# Patient Record
Sex: Female | Born: 1995 | Race: Black or African American | Hispanic: No | Marital: Single | Smoking: Never smoker
Health system: Southern US, Community
[De-identification: ages and names within clinical notes are randomized; demographics above are authoritative.]

## PROBLEM LIST (undated history)

## (undated) DIAGNOSIS — N6002 Solitary cyst of left breast: Secondary | ICD-10-CM

---
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
## 2016-07-22 ENCOUNTER — Emergency Department (HOSPITAL_COMMUNITY)
Admission: EM | Admit: 2016-07-22 | Discharge: 2016-07-22 | Disposition: A | Discharge status: Home / Self Care | Payer: BLUE CROSS/BLUE SHIELD | Attending: Emergency Medicine | Admitting: Emergency Medicine

## 2016-07-22 DIAGNOSIS — H109 Unspecified conjunctivitis: Secondary | ICD-10-CM | POA: Insufficient documentation

## 2016-07-22 DIAGNOSIS — H02843 Edema of right eye, unspecified eyelid: Secondary | ICD-10-CM

## 2016-07-22 DIAGNOSIS — H578 Other specified disorders of eye and adnexa: Secondary | ICD-10-CM | POA: Diagnosis present

## 2016-07-22 MED ORDER — TETRACAINE HCL 0.5 % OP SOLN
2.0000 [drp] | Freq: Once | OPHTHALMIC | Status: AC
  Administered 2016-07-22: 2 [drp] via OPHTHALMIC
  Filled 2016-07-22: qty 4

## 2016-07-22 MED ORDER — IBUPROFEN 600 MG PO TABS: 600.0000 mg | ORAL_TABLET | Freq: Four times a day (QID) | ORAL | 0 refills | Status: AC | PRN

## 2016-07-22 MED ORDER — FLUORESCEIN SODIUM 1 MG OP STRP
1.0000 | ORAL_STRIP | Freq: Once | OPHTHALMIC | Status: AC
  Administered 2016-07-22: 1 via OPHTHALMIC
  Filled 2016-07-22: qty 1

## 2016-07-22 MED ORDER — IBUPROFEN 200 MG PO TABS
600.0000 mg | ORAL_TABLET | Freq: Once | ORAL | Status: AC
  Administered 2016-07-22: 600 mg via ORAL
  Filled 2016-07-22: qty 3

## 2016-07-22 MED ORDER — ERYTHROMYCIN 5 MG/GM OP OINT: TOPICAL_OINTMENT | OPHTHALMIC | 0 refills | Status: AC

## 2016-07-22 NOTE — ED Notes (Signed)
PT DISCHARGED. INSTRUCTIONS AND PRESCRIPTIONS GIVEN. AAOX4. PT IN NO APPARENT DISTRESS. THE OPPORTUNITY TO ASK QUESTIONS WAS PROVIDED. 

## 2016-07-22 NOTE — ED Triage Notes (Signed)
Pt states that she woke up with blurred vision in the R eye but now has R eye lid swelling. Wore contacts yesterday. Alert and oriented.

## 2016-07-22 NOTE — ED Provider Notes (Signed)
[facility]-EMERGENCY DEPT Provider Note   CSN: [id number] Arrival date & time: 07/22/16  1956  First Provider Contact:  First MD Initiated Contact with Patient 07/22/16 2008   By signing my name below, I, Evon Slack, attest that this documentation has been prepared under the direction and in the presence of Briellah Baik Y Varian Innes, [place]. Electronically Signed: Evon Slack, ED Scribe. 07/22/16. 8:13 PM.     History   Chief Complaint Chief Complaint  Patient presents with  . Facial Swelling    HPI The history is provided by the patient. No language interpreter was used.   HPI Comments: Brenda Cameron is a 20 y.o. female who presents to the Emergency Department complaining of right eyelid swelling onset today. Pt states that she has had associated pain and blurred vision in the right eye. She states she woke up this morning and noticed her right eye vision was slightly blurry. States over the course of the day the eye became painful and here eyelid became swollen. She felt like it was hurting on the surface of her eyeball. Pt reports that she did wear contacts 1 day ago but typically wears her glasses for the most part.  Pt doesn't report any medications PTA. Pt denies injury or trauma to the eye. Denies floaters. Pt denies loss of vision. Denies discharge but endorses tearing due to the pain. Denies itching.   No past medical history on file.  There are no active problems to display for this patient.   No past surgical history on file.  OB History    No data available       Home Medications    Prior to Admission medications   Medication Sig Start Date End Date Taking? Authorizing Provider  albuterol (PROVENTIL HFA;VENTOLIN HFA) 108 (90 BASE) MCG/ACT inhaler Inhale 3 puffs into the lungs every 6 (six) hours as needed.    Historical Provider, MD  loratadine-pseudoephedrine (CLARITIN-D 24-HOUR) 10-240 MG per 24 hr tablet Take 1 tablet by mouth daily.    Historical Provider, MD     Family History No family history on file.  Social History Social History  Substance Use Topics  . Smoking status: Not on file  . Smokeless tobacco: Not on file  . Alcohol use Not on file     Allergies   Codeine   Review of Systems Review of Systems  HENT: Positive for facial swelling (right eye).   Eyes: Positive for pain and visual disturbance.  10 Systems reviewed and are negative for acute change except as noted in the HPI.    Physical Exam Updated Vital Signs BP 139/81 (BP Location: Left Arm)   Pulse 93   Temp 98.6 F (37 C) (Oral)   Resp 18   LMP 07/11/2016 (Approximate)   SpO2 100%   Physical Exam  Constitutional: She is oriented to person, place, and time. She appears well-developed and well-nourished. No distress.  HENT:  Head: Normocephalic and atraumatic.  Eyes: EOM are normal. Pupils are equal, round, and reactive to light.  R eyelid edematous. Minimal tenderness. No erythema L external eye unremarkable R conjunctiva injected. EOM intact. PERRL.  No fluorescein uptake on exam IOP 20 bilaterally  Neck: Neck supple. No tracheal deviation present.  Cardiovascular: Normal rate.   Pulmonary/Chest: Effort normal. No respiratory distress.  Musculoskeletal: Normal range of motion.  Neurological: She is alert and oriented to person, place, and time.  Skin: Skin is warm and dry.  Psychiatric: She has a normal mood and affect.  Her behavior is normal.  Nursing note and vitals reviewed.     Visual Acuity  Right Eye Distance:   Left Eye Distance:   Bilateral Distance:    Right Eye Near:   Left Eye Near:    Bilateral Near:      ED Treatments / Results  Labs (all labs ordered are listed, but only abnormal results are displayed) Labs Reviewed - No data to display  EKG  EKG Interpretation None       Radiology No results found.  Procedures Procedures (including critical care time)  Medications Ordered in ED Medications  tetracaine  (PONTOCAINE) 0.5 % ophthalmic solution 2 drop (not administered)  fluorescein ophthalmic strip 1 strip (not administered)     Initial Impression / Assessment and Plan / ED Course  I have reviewed the triage vital signs and the nursing notes.  Pertinent labs & imaging results that were available during my care of the patient were reviewed by me and considered in my medical decision making (see chart for details).  Pt is an otherwise healthy 20 y.o. female presenting with right eye complaints. She states it started as somewhat blurry vision this morning, now her eye is red and painful with upper eyelid edema. No fluorescein uptake noted on exam. IOP normal. Right eye conjunctiva is diffusely injected. Pt did not bring her glasses with her so visual acuity screening is difficult but her right eye does have mildly decreased acuity compared to right. Will cover for corneal or bacterial process with erythromycin ointment. Ibuprofen given as well. Low suspicion for uveitis/iritis. Doubt acute glaucoma. However, instructed close f/u with ophthalmology within 24 hrs. Strict ER return precautions given.   Final Clinical Impressions(s) / ED Diagnoses   Final diagnoses:  Conjunctivitis of right eye  Swelling of eyelid, right    New Prescriptions New Prescriptions   ERYTHROMYCIN OPHTHALMIC OINTMENT    Place a 1/2 inch ribbon of ointment into the lower eyelid three times daily   IBUPROFEN (ADVIL,MOTRIN) 600 MG TABLET    Take 1 tablet (600 mg total) by mouth every 6 (six) hours as needed.     I personally performed the services described in this documentation, which was scribed in my presence. The recorded information has been reviewed and is accurate.    Carlene Coria, Cordelia Poche 07/22/16 2116    Zadie Rhine, MD 07/22/16 [phone]

## 2016-07-22 NOTE — Discharge Instructions (Signed)
Use the erythromycin ointment as directed. Please call Dr. Margaretmary Eddy office in the morning to schedule a follow up appointment as soon as possible.

## 2016-07-22 NOTE — ED Notes (Addendum)
UNABLE TO PERFORM VISUAL ACUITY DUE TO PT NOT HAVING HER GLASSES PRESENT. PA SERENA MADE AWARE.

## 2019-07-22 ENCOUNTER — Other Ambulatory Visit: Payer: Self-pay | Admitting: Obstetrics and Gynecology

## 2019-07-22 ENCOUNTER — Other Ambulatory Visit (HOSPITAL_COMMUNITY)
Admission: RE | Admit: 2019-07-22 | Discharge: 2019-07-22 | Disposition: A | Discharge status: Home / Self Care | Payer: 59 | Source: Ambulatory Visit | Attending: Obstetrics and Gynecology | Admitting: Obstetrics and Gynecology

## 2019-07-22 DIAGNOSIS — Z01419 Encounter for gynecological examination (general) (routine) without abnormal findings: Secondary | ICD-10-CM | POA: Diagnosis present

## 2019-07-23 LAB — CYTOLOGY - PAP
Chlamydia: POSITIVE — AB
Diagnosis: NEGATIVE
Neisseria Gonorrhea: NEGATIVE

## 2020-03-03 ENCOUNTER — Ambulatory Visit: Payer: 59

## 2020-03-03 ENCOUNTER — Ambulatory Visit: Payer: 59 | Attending: Internal Medicine

## 2020-03-03 DIAGNOSIS — Z23 Encounter for immunization: Secondary | ICD-10-CM | POA: Insufficient documentation

## 2020-03-03 NOTE — Progress Notes (Signed)
   Covid-19 Vaccination Clinic  Name:  Brenda Cameron    MRN: [id number] DOB: [birth year]  03/03/2020  [name] was observed post Covid-19 immunization for 30 minutes based on pre-vaccination screening without incident. She was provided with Vaccine Information Sheet and instruction to access the V-Safe system.   [name] was instructed to call 911 with any severe reactions post vaccine: Marland Kitchen Difficulty breathing  . Swelling of face and throat  . A fast heartbeat  . A bad rash all over body  . Dizziness and weakness   Immunizations Administered    Name Date Dose VIS Date Route   Pfizer COVID-19 Vaccine 03/03/2020  4:41 PM 0.3 mL 12/11/2019 Intramuscular   Manufacturer: [employer], [employer]   Lot: [id number]   NDC: [address]-0

## 2020-03-30 ENCOUNTER — Ambulatory Visit: Payer: 59 | Attending: Internal Medicine

## 2020-03-30 DIAGNOSIS — Z23 Encounter for immunization: Secondary | ICD-10-CM

## 2020-03-30 NOTE — Progress Notes (Signed)
   Covid-19 Vaccination Clinic  Name:  TYLYN STANKOVICH    MRN: [id number] DOB: [birth year]  03/30/2020  Ms. Cullop was observed post Covid-19 immunization for 15 minutes without incident. She was provided with Vaccine Information Sheet and instruction to access the V-Safe system.   Ms. Demattia was instructed to call 911 with any severe reactions post vaccine: Marland Kitchen Difficulty breathing  . Swelling of face and throat  . A fast heartbeat  . A bad rash all over body  . Dizziness and weakness   Immunizations Administered    Name Date Dose VIS Date Route   Pfizer COVID-19 Vaccine 03/30/2020  4:42 PM 0.3 mL 12/11/2019 Intramuscular   Manufacturer: [employer], [employer]   Lot: [id number]   NDC: [address]-7

## 2023-05-25 ENCOUNTER — Emergency Department (HOSPITAL_COMMUNITY)
Admission: EM | Admit: 2023-05-25 | Discharge: 2023-05-25 | Disposition: A | Discharge status: Home / Self Care | Payer: 59 | Attending: Emergency Medicine | Admitting: Emergency Medicine

## 2023-05-25 ENCOUNTER — Other Ambulatory Visit: Payer: Self-pay

## 2023-05-25 ENCOUNTER — Encounter (HOSPITAL_COMMUNITY): Payer: Self-pay | Admitting: *Deleted

## 2023-05-25 DIAGNOSIS — N611 Abscess of the breast and nipple: Secondary | ICD-10-CM

## 2023-05-25 HISTORY — DX: Solitary cyst of left breast: N60.02

## 2023-05-25 NOTE — Discharge Instructions (Addendum)
You were seen in the emergency department with concerns for a breast abscess. Based on my evaluation of you, there does appear to be some mass/fluid in the left breast. Given the risk of drainage of a breast abscess without the proper imaging, these are typically managed at the [facility]. I have provided you a referral to [facility] who will facilitate getting this all arranged for you. You may take Tylenol, Ibuprofen, or Aleve for pain as needed. If you are concerned that symptoms are worsening or you begin to experience significant redness or drainage, please return to the ER for evaluation.

## 2023-05-25 NOTE — ED Triage Notes (Signed)
Cyst on left breast which has increased in size since Sunday. Has history and has had it darined in the past.

## 2023-05-25 NOTE — ED Provider Notes (Signed)
[place] EMERGENCY DEPARTMENT AT [facility] Provider Note   CSN: [id number] Arrival date & time: 05/25/23  1840     History Chief Complaint  Patient presents with   Cyst    Brenda Cameron is a 27 y.o. female. Patient presented to the ED with concerns for a breast cyst. She reports that this area has been enlarging over the last few days and become increasingly painful. Not pregnant or breastfeeding at this time. Denies drainage coming from breast. No fevers. Has previously had a similar mass in her breast that she had I&D at that time and has not had recurrence of abscess since about 2016. Does not currently have a PCP.   HPI     Home Medications Prior to Admission medications   Medication Sig Start Date End Date Taking? Authorizing Provider  albuterol (PROVENTIL HFA;VENTOLIN HFA) 108 (90 BASE) MCG/ACT inhaler Inhale 3 puffs into the lungs every 6 (six) hours as needed.    [provider]  erythromycin ophthalmic ointment Place a 1/2 inch ribbon of ointment into the lower eyelid three times daily 07/22/16   Eliseo Squires, PA-C  ibuprofen (ADVIL,MOTRIN) 600 MG tablet Take 1 tablet (600 mg total) by mouth every 6 (six) hours as needed. 07/22/16   Eliseo Squires, PA-C  loratadine-pseudoephedrine (CLARITIN-D 24-HOUR) 10-240 MG per 24 hr tablet Take 1 tablet by mouth daily.    [provider]      Allergies    Codeine    Review of Systems   Review of Systems  Skin:        Breast mass  All other systems reviewed and are negative.   Physical Exam Updated Vital Signs BP (!) 144/94   Pulse 74   Temp 98.2 F (36.8 C) (Oral)   Resp 16   Ht 5\' 2"  (1.575 m)   Wt 92.1 kg   SpO2 98%   BMI 37.13 kg/m  Physical Exam Vitals and nursing note reviewed.  Constitutional:      General: She is not in acute distress.    Appearance: She is well-developed.  HENT:     Head: Normocephalic and atraumatic.  Eyes:     Conjunctiva/sclera: Conjunctivae  normal.  Cardiovascular:     Rate and Rhythm: Normal rate and regular rhythm.     Heart sounds: No murmur heard. Pulmonary:     Effort: Pulmonary effort is normal. No respiratory distress.     Breath sounds: Normal breath sounds.  Abdominal:     Palpations: Abdomen is soft.     Tenderness: There is no abdominal tenderness.  Musculoskeletal:        General: No swelling.     Cervical back: Neck supple.  Skin:    General: Skin is warm and dry.     Capillary Refill: Capillary refill takes less than 2 seconds.     Findings: Abscess present.          Comments: Left breast with notable mass present. Firm in quality with some mild fluctuance and mobile in nature.  Neurological:     Mental Status: She is alert.  Psychiatric:        Mood and Affect: Mood normal.     ED Results / Procedures / Treatments   Labs (all labs ordered are listed, but only abnormal results are displayed) Labs Reviewed - No data to display  EKG None  Radiology No results found.  Procedures Procedures   Medications Ordered in ED Medications -  No data to display  ED Course/ Medical Decision Making/ A&P                           Medical Decision Making  This patient presents to the ED for concern of cyst. differential diagnosis includes breast abscess, mastitis, cyst, breast malignancy   Problem List / ED Course:  Patient presented to the ED with concerns of a breast cyst. She has previously had this area drained about 8 years ago without issues. States that the area has become particularly enlarged over the last 3 days and becoming more painful. No significant erythema surrounding the area. Given that there is a mass located directly underneath the left breath, difficult to determine exact cause. Advised patient that these are typically managed by the breast center as there can be some risk of I&D if the area is concerning for malignancy. Provided patient with a referral to [facility] who will coordinate orders for evaluation at breast center. In the meantime, advised patient to take Tylenol, ibuprofen, or Aleve as needed for pain. Patient is agreeable with treatment plan and verbalized understanding all return precautions. I believe patient is currently stable for discharge home and outpatient evaluation. All questions answered prior to patient discharge.   Social Determinants of Health:  No PCP  Final Clinical Impression(s) / ED Diagnoses Final diagnoses:  Left breast abscess    Rx / DC Orders ED Discharge Orders          Ordered    Ambulatory referral to Family Practice       Comments: Breast abscess   05/25/23 1959              Salomon Mast 05/25/23 2033    Rolan Bucco, MD 05/25/23 2149
